# Patient Record
Sex: Male | Born: 1940 | Race: White | Hispanic: No | Marital: Married | Smoking: Never smoker
Health system: Southern US, Community
[De-identification: ages and names within clinical notes are randomized; demographics above are authoritative.]

## PROBLEM LIST (undated history)

## (undated) DIAGNOSIS — J45909 Unspecified asthma, uncomplicated: Secondary | ICD-10-CM

## (undated) DIAGNOSIS — I10 Essential (primary) hypertension: Secondary | ICD-10-CM

## (undated) DIAGNOSIS — E119 Type 2 diabetes mellitus without complications: Secondary | ICD-10-CM

## (undated) HISTORY — PX: REPLACEMENT TOTAL KNEE: SUR1224

## (undated) HISTORY — PX: BACK SURGERY: SHX140

## (undated) HISTORY — PX: CARPAL TUNNEL RELEASE: SHX101

---

## 2016-03-12 ENCOUNTER — Emergency Department (HOSPITAL_COMMUNITY)
Admission: EM | Admit: 2016-03-12 | Discharge: 2016-03-13 | Disposition: A | Discharge status: Home / Self Care | Payer: Medicare Other | Attending: Emergency Medicine | Admitting: Emergency Medicine

## 2016-03-12 ENCOUNTER — Encounter (HOSPITAL_COMMUNITY): Payer: Self-pay

## 2016-03-12 ENCOUNTER — Emergency Department (HOSPITAL_COMMUNITY): Payer: Medicare Other

## 2016-03-12 DIAGNOSIS — R509 Fever, unspecified: Secondary | ICD-10-CM | POA: Diagnosis not present

## 2016-03-12 DIAGNOSIS — M545 Low back pain, unspecified: Secondary | ICD-10-CM

## 2016-03-12 DIAGNOSIS — I10 Essential (primary) hypertension: Secondary | ICD-10-CM | POA: Diagnosis not present

## 2016-03-12 DIAGNOSIS — J45909 Unspecified asthma, uncomplicated: Secondary | ICD-10-CM | POA: Diagnosis not present

## 2016-03-12 DIAGNOSIS — E119 Type 2 diabetes mellitus without complications: Secondary | ICD-10-CM | POA: Insufficient documentation

## 2016-03-12 HISTORY — DX: Essential (primary) hypertension: I10

## 2016-03-12 HISTORY — DX: Type 2 diabetes mellitus without complications: E11.9

## 2016-03-12 HISTORY — DX: Unspecified asthma, uncomplicated: J45.909

## 2016-03-12 MED ORDER — KETOROLAC TROMETHAMINE 30 MG/ML IJ SOLN
30.0000 mg | Freq: Once | INTRAMUSCULAR | Status: AC
  Administered 2016-03-12: 30 mg via INTRAVENOUS
  Filled 2016-03-12: qty 1

## 2016-03-12 MED ORDER — HYDROMORPHONE HCL 1 MG/ML IJ SOLN
1.0000 mg | Freq: Once | INTRAMUSCULAR | Status: AC
  Administered 2016-03-12: 1 mg via INTRAVENOUS
  Filled 2016-03-12: qty 1

## 2016-03-12 MED ORDER — ONDANSETRON HCL 4 MG/2ML IJ SOLN
4.0000 mg | Freq: Once | INTRAMUSCULAR | Status: AC
  Administered 2016-03-12: 4 mg via INTRAVENOUS
  Filled 2016-03-12: qty 2

## 2016-03-12 MED ORDER — SODIUM CHLORIDE 0.9 % IV BOLUS (SEPSIS)
500.0000 mL | Freq: Once | INTRAVENOUS | Status: AC
  Administered 2016-03-12: 500 mL via INTRAVENOUS

## 2016-03-12 NOTE — ED Notes (Signed)
Also c/o left leg pain and weakness

## 2016-03-12 NOTE — ED Provider Notes (Signed)
CSN: [id number]     Arrival date & time 03/12/16  2004 History  By signing my name below, I, Emmanuella Mensah, attest that this documentation has been prepared under the direction and in the presence of Donnetta HutchingBrian Alyx Gee, MD. Electronically Signed: Angelene GiovanniEmmanuella Mensah, ED Scribe. 03/12/2016. 9:38 PM.    Chief Complaint  Patient presents with  . Back Pain   Patient is a 75 y.o. male presenting with back pain. The history is provided by the patient. No language interpreter was used.  Back Pain Location:  Lumbar spine Radiates to:  L foot and R foot Pain severity:  Moderate Pain is:  Same all the time Onset quality:  Gradual Duration:  3 days Timing:  Constant Progression:  Worsening Chronicity:  New Relieved by:  Nothing Worsened by:  Nothing tried Ineffective treatments:  OTC medications Associated symptoms: fever   Associated symptoms: no bladder incontinence, no bowel incontinence, no chest pain, no dysuria, no numbness, no tingling and no weakness   Risk factors: recent surgery    HPI Comments: Javier Woods is a 75 y.o. male with a hx of DM and HTN who presents to the Emergency Department complaining of gradually worsening moderate lower back pain that radiates down his bilateral legs (worse on left) onset 3 days ago. He reports associated constipation onset 1 week ago. Pt reports a hx of back surgery by Dr. Orpah GreekAbrams in [place], [place] on 12/26/15. He states that he has been taking Tylenol for his pain with no relief. He reports that he [facility] days ago for these symptoms where he received Dilaudid 2 mg tablets. He denies any fever, chills, chest pain, numbness, weakness, bowel/bladder incontinence, or any urinary symptoms. Pt has upcoming appointment with his surgeon in 2 days.   Past Medical History  Diagnosis Date  . Hypertension   . Diabetes mellitus without complication (HCC)   . Asthma    Past Surgical History  Procedure Laterality Date  . Back  surgery    . Replacement total knee    . Replacement total knee    . Carpal tunnel release     History reviewed. No pertinent family history. Social History  Substance Use Topics  . Smoking status: Never Smoker   . Smokeless tobacco: None  . Alcohol Use: No    Review of Systems  Constitutional: Positive for fever. Negative for chills.  Cardiovascular: Negative for chest pain.  Gastrointestinal: Negative for bowel incontinence.  Genitourinary: Negative for bladder incontinence, dysuria and hematuria.  Musculoskeletal: Positive for back pain and arthralgias.  Neurological: Negative for tingling, weakness and numbness.  All other systems reviewed and are negative.     Allergies  Review of patient's allergies indicates no known allergies.  Home Medications   Prior to Admission medications   Not on File   BP 120/67 mmHg  Pulse 95  Temp(Src) 97.8 F (36.6 C) (Oral)  Resp 20  Ht 5\' 5"  (1.651 m)  Wt 240 lb (108.863 kg)  BMI 39.94 kg/m2  SpO2 95% Physical Exam  Constitutional: He is oriented to person, place, and time. He appears well-developed and well-nourished.  HENT:  Head: Normocephalic and atraumatic.  Eyes: Conjunctivae are normal.  Neck: Neck supple.  Cardiovascular: Normal rate and regular rhythm.   Pulmonary/Chest: Effort normal and breath sounds normal.  Abdominal: Soft. Bowel sounds are normal.  Musculoskeletal: Normal range of motion.  Neurological: He is alert and oriented to person, place, and time.  Skin: Skin  is warm and dry.  Vertical surgical scar on his lower lumbar spine, nontender  Psychiatric: He has a normal mood and affect. His behavior is normal.  Nursing note and vitals reviewed.   ED Course  Procedures (including critical care time) DIAGNOSTIC STUDIES: Oxygen Saturation is 98% on RA, normal by my interpretation.    COORDINATION OF CARE: 9:21 PM- Pt advised of plan for treatment and pt agrees. Pt will receive CT lumbar for further  evaluation. He will also receive IV fluids, Toradol, Dilaudid, and Zofran.    Labs Review Labs Reviewed  BASIC METABOLIC PANEL  CBC WITH DIFFERENTIAL/PLATELET    Imaging Review Ct Lumbar Spine Wo Contrast  03/12/2016  CLINICAL DATA:  Moderate low back pain radiating to bilateral legs EXAM: CT LUMBAR SPINE WITHOUT CONTRAST TECHNIQUE: Multidetector CT imaging of the lumbar spine was performed without intravenous contrast administration. Multiplanar CT image reconstructions were also generated. COMPARISON:  None. FINDINGS: The vertebral body heights are maintained. There is no acute fracture. Minimal retrolisthesis of L3 on L4. The intraspinal soft tissues are not fully imaged on this examination due to poor soft tissue contrast, but there is no gross soft tissue abnormality. There are mild degenerative changes of bilateral sacroiliac joints. 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 Posterior decompression from L2 through L5. Moderate broad-based disc bulge at L4-5. Complex fluid collection in the posterior paraspinal soft tissues which abuts the posterior margin of the thecal sac at L3-4 and L4-5 with the collection measuring approximately 3 x 2.4 x 5.5 cm with a few bubbles of air within the fluid concerning for an abscess. There is concern for severe spinal stenosis at L4-5. IMPRESSION: 1. Posterior decompression from L2 through L5. Complex fluid collection in the posterior paraspinal soft tissues which abuts the posterior margin of the thecal sac at L3-4 and L4-5 with the collection measuring approximately 3 x 2.4 x 5.5 cm with a few bubbles of air within the fluid concerning for an abscess. Recommend further evaluation with an MRI of the lumbar spine without and with intravenous contrast. 2. Moderate broad-based disc bulge at L4-5. There is concern for severe spinal stenosis at L4-5. Electronically Signed   By: Elige Ko   On: 03/12/2016  22:29     Donnetta Hutching, MD has personally reviewed and evaluated these images and lab results as part of his medical decision-making.   MDM   Final diagnoses:  Midline low back pain without sciatica    Discussed CT report with neurosurgeon in [place] Dr. Valentina Shaggy.  Patient will return to the [facility] ED to be admitted for further evaluation including an MRI of the spine.  I personally performed the services described in this documentation, which was scribed in my presence. The recorded information has been reviewed and is accurate.    Donnetta Hutching, MD 03/13/16 [phone]

## 2016-03-12 NOTE — Discharge Instructions (Signed)
[facility] where you will be evaluated. Neurosurgeon will see you in the morning.  Take copy of CD of xray with you.

## 2016-03-12 NOTE — ED Notes (Signed)
Lumbar surgery in May. Patient began having pain 03/09/16, unable to control pain today. Also c/o constipation X1 week.

## 2016-03-13 DIAGNOSIS — M545 Low back pain: Secondary | ICD-10-CM | POA: Diagnosis not present

## 2016-03-13 LAB — BASIC METABOLIC PANEL
ANION GAP: 5 (ref 5–15)
BUN: 16 mg/dL (ref 6–20)
CO2: 25 mmol/L (ref 22–32)
Calcium: 8.3 mg/dL — ABNORMAL LOW (ref 8.9–10.3)
Chloride: 103 mmol/L (ref 101–111)
Creatinine, Ser: 0.55 mg/dL — ABNORMAL LOW (ref 0.61–1.24)
Glucose, Bld: 127 mg/dL — ABNORMAL HIGH (ref 65–99)
POTASSIUM: 3.8 mmol/L (ref 3.5–5.1)
SODIUM: 133 mmol/L — AB (ref 135–145)

## 2016-03-13 LAB — CBC WITH DIFFERENTIAL/PLATELET
BASOS ABS: 0 10*3/uL (ref 0.0–0.1)
Basophils Relative: 0 %
EOS PCT: 0 %
Eosinophils Absolute: 0 10*3/uL (ref 0.0–0.7)
HEMATOCRIT: 32.9 % — AB (ref 39.0–52.0)
Hemoglobin: 10.5 g/dL — ABNORMAL LOW (ref 13.0–17.0)
LYMPHS PCT: 22 %
Lymphs Abs: 2 10*3/uL (ref 0.7–4.0)
MCH: 28.7 pg (ref 26.0–34.0)
MCHC: 31.9 g/dL (ref 30.0–36.0)
MCV: 89.9 fL (ref 78.0–100.0)
Monocytes Absolute: 1.3 10*3/uL — ABNORMAL HIGH (ref 0.1–1.0)
Monocytes Relative: 14 %
NEUTROS ABS: 5.9 10*3/uL (ref 1.7–7.7)
Neutrophils Relative %: 64 %
PLATELETS: 384 10*3/uL (ref 150–400)
RBC: 3.66 MIL/uL — AB (ref 4.22–5.81)
RDW: 14.6 % (ref 11.5–15.5)
WBC: 9.3 10*3/uL (ref 4.0–10.5)

## 2016-03-13 MED ORDER — HYDROMORPHONE HCL 1 MG/ML IJ SOLN
1.0000 mg | Freq: Once | INTRAMUSCULAR | Status: AC
  Administered 2016-03-13: 1 mg via INTRAVENOUS

## 2016-03-13 MED ORDER — HYDROMORPHONE HCL 1 MG/ML IJ SOLN
1.0000 mg | Freq: Once | INTRAMUSCULAR | Status: DC
  Filled 2016-03-13: qty 1

## 2016-03-13 NOTE — ED Notes (Signed)
Report given to ED charge nurse in [place]

## 2016-03-13 NOTE — ED Notes (Signed)
Wife prefers that pt be transferred via EMS due to pain and mobility

## 2017-01-07 IMAGING — CT CT L SPINE W/O CM
3 series · 14 of 33 positions shown, 17 images · non-contrast
Comparison: None.

CLINICAL DATA: Moderate low back pain radiating to bilateral legs

EXAM:
CT LUMBAR SPINE WITHOUT CONTRAST
TECHNIQUE: Multidetector CT imaging of the lumbar spine was performed without
intravenous contrast administration. Multiplanar CT image
reconstructions were also generated.

[Series 3: l spine soft · axial · 0.34mm/px · z∈[-315,-145]mm · 6 of 111 slices shown, 8 images]
[im 17/111  soft-tissue]
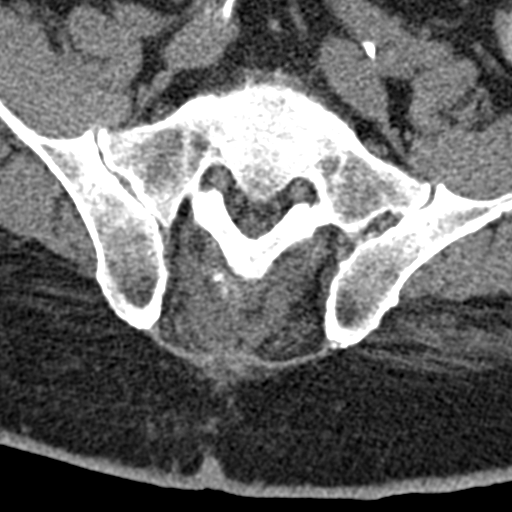
[im 17/111  bone]
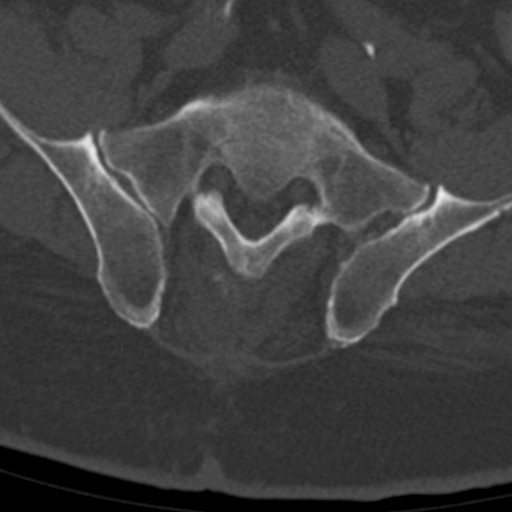
[im 34/111  bone]
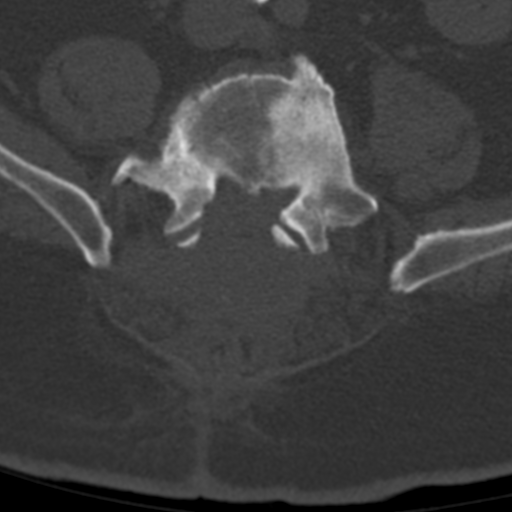
[im 51/111  bone]
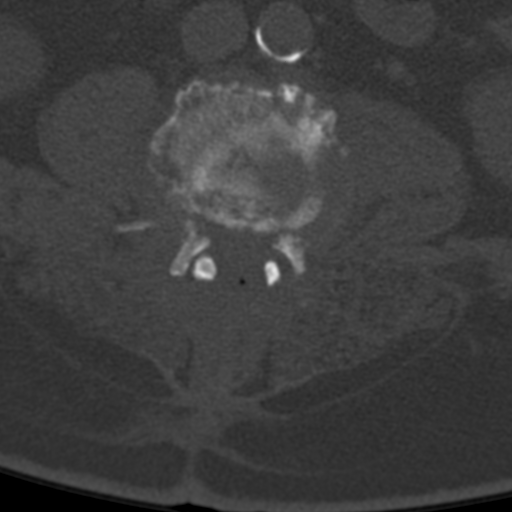
[im 68/111  bone]
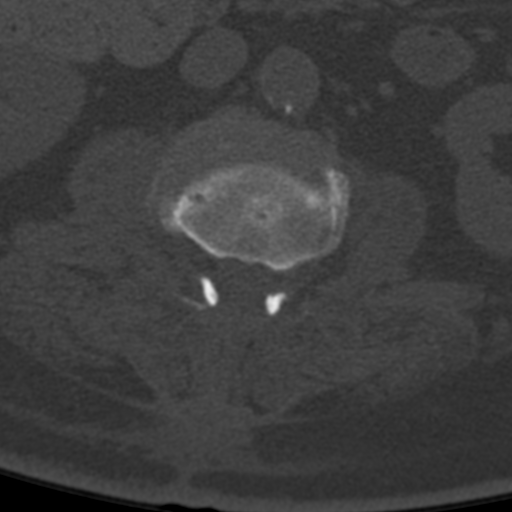
[im 85/111  soft-tissue]
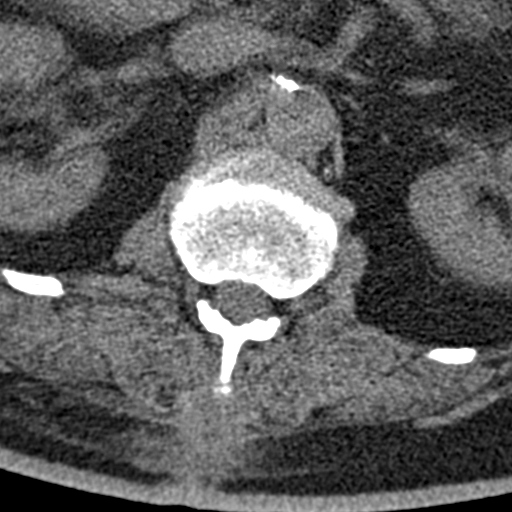
[im 85/111  bone]
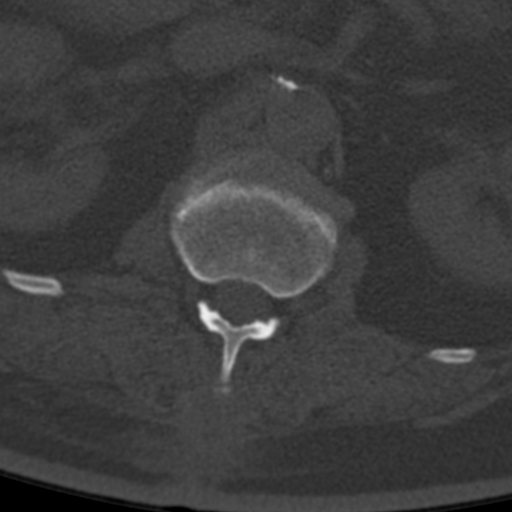
[im 102/111  bone]
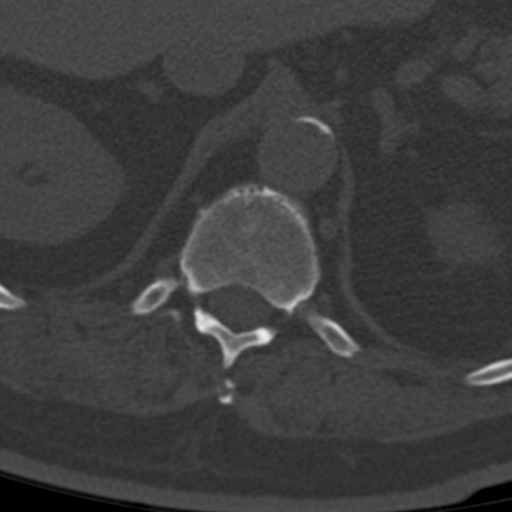

[Series 4: sagittal bone · sagittal · 0.35mm/px · 5 of 76 slices shown, 6 images]
[im 26/76  bone]
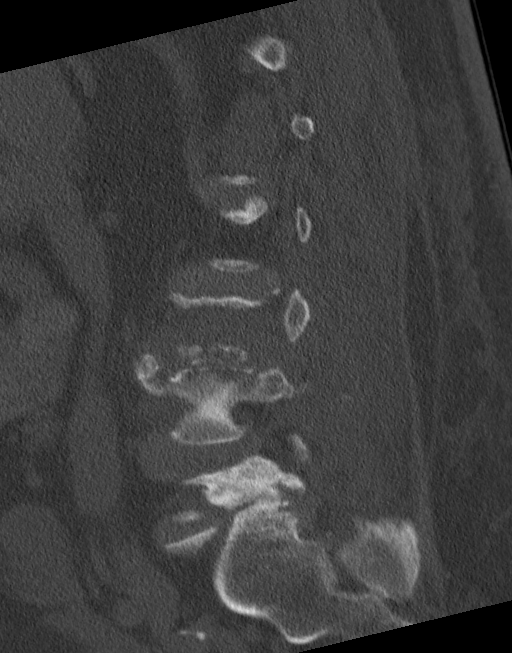
[im 32/76  bone]
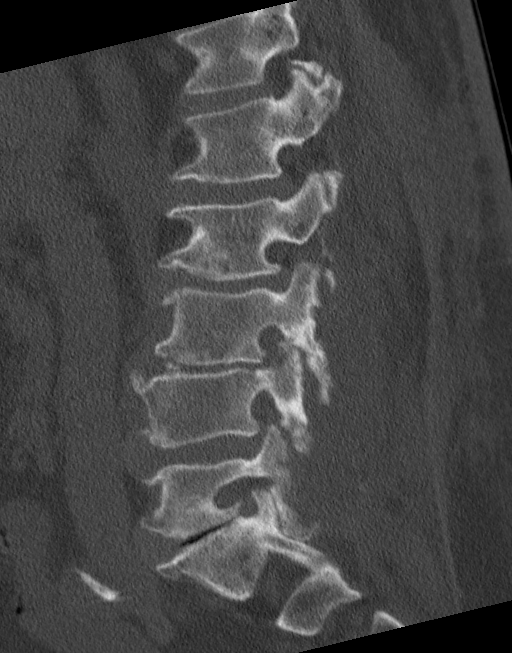
[im 38/76  soft-tissue]
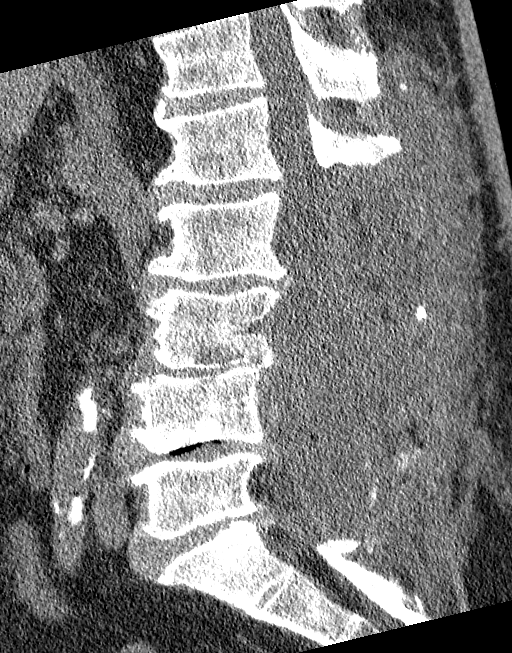
[im 38/76  bone]
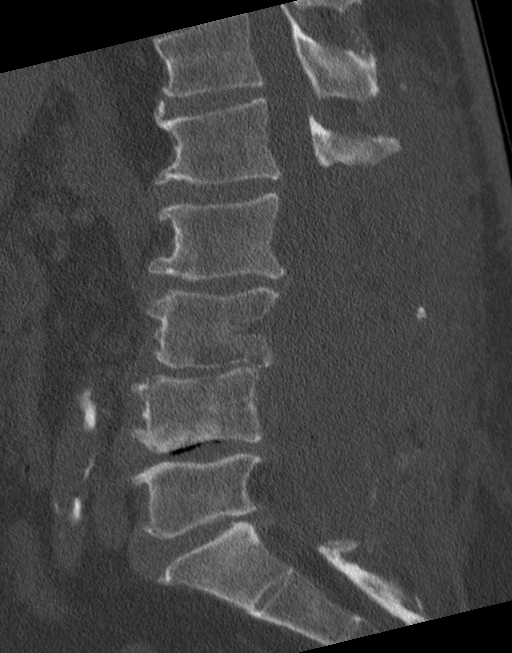
[im 44/76  bone]
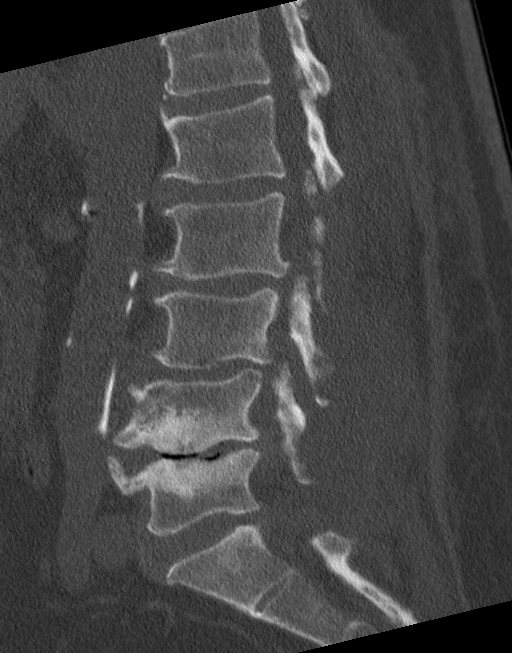
[im 51/76  bone]
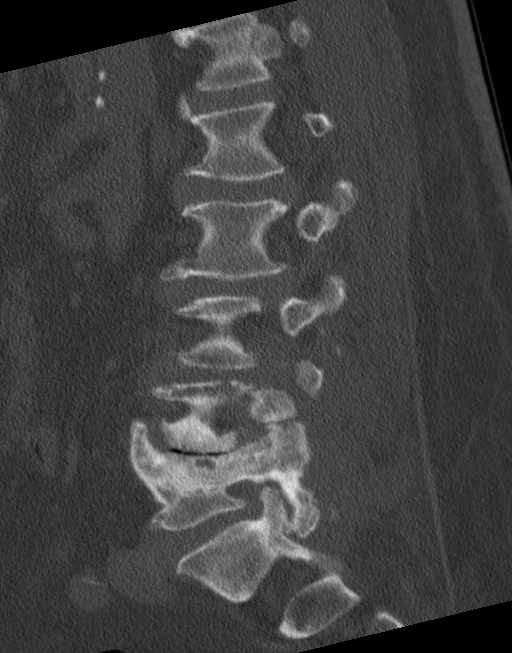

[Series 5: coronal bone · coronal · 0.36mm/px · 3 of 81 slices shown]
[im 17/81  bone]
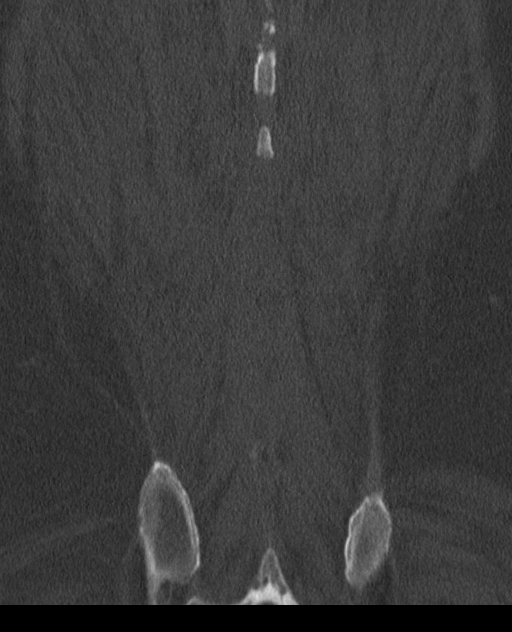
[im 33/81  bone]
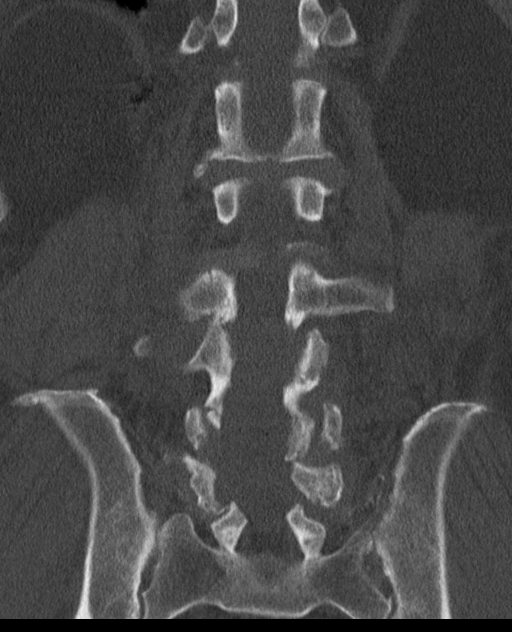
[im 49/81  bone]
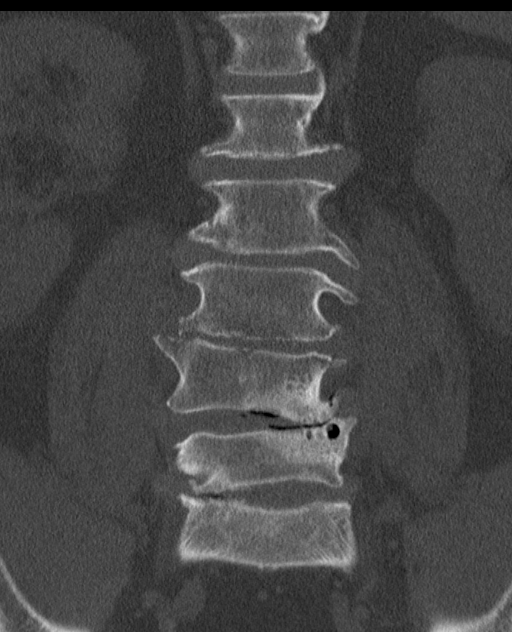

[14 of 33 positions shown; findings below may reference images not displayed]

FINDINGS: The vertebral body heights are maintained. There is no acute
fracture. Minimal retrolisthesis of L3 on L4. The intraspinal soft
tissues are not fully imaged on this examination due to poor soft
tissue contrast, but there is no gross soft tissue abnormality.
There are mild degenerative changes of bilateral sacroiliac joints.

Degenerative disc disease with disc height loss at L3-4 and L4-5
with vacuum disc phenomenon at L4-5. Posterior decompression from L2
through L5.

Moderate broad-based disc bulge at L4-5. Complex fluid collection in
the posterior paraspinal soft tissues which abuts the posterior
margin of the thecal sac at L3-4 and L4-5 with the collection
measuring approximately 3 x 2.4 x 5.5 cm with a few bubbles of air
within the fluid concerning for an abscess. There is concern for
severe spinal stenosis at L4-5.
IMPRESSION: 1. Posterior decompression from L2 through L5. Complex fluid
collection in the posterior paraspinal soft tissues which abuts the
posterior margin of the thecal sac at L3-4 and L4-5 with the
collection measuring approximately 3 x 2.4 x 5.5 cm with a few
bubbles of air within the fluid concerning for an abscess. Recommend
further evaluation with an MRI of the lumbar spine without and with
intravenous contrast.
2. Moderate broad-based disc bulge at L4-5. There is concern for
severe spinal stenosis at L4-5.
